# Patient Record
Sex: Female | Born: 1975 | Race: Black or African American | Hispanic: No | Marital: Married | Smoking: Never smoker
Health system: Southern US, Community
[De-identification: ages and names within clinical notes are randomized; demographics above are authoritative.]

## PROBLEM LIST (undated history)

## (undated) HISTORY — PX: WISDOM TOOTH EXTRACTION: SHX21

---

## 2019-11-29 ENCOUNTER — Other Ambulatory Visit: Payer: Self-pay | Admitting: Internal Medicine

## 2019-11-29 DIAGNOSIS — Z1159 Encounter for screening for other viral diseases: Secondary | ICD-10-CM | POA: Diagnosis not present

## 2019-11-29 DIAGNOSIS — Z13220 Encounter for screening for lipoid disorders: Secondary | ICD-10-CM | POA: Diagnosis not present

## 2019-11-29 DIAGNOSIS — Z1231 Encounter for screening mammogram for malignant neoplasm of breast: Secondary | ICD-10-CM | POA: Diagnosis not present

## 2019-11-29 DIAGNOSIS — Z114 Encounter for screening for human immunodeficiency virus [HIV]: Secondary | ICD-10-CM | POA: Diagnosis not present

## 2019-11-29 DIAGNOSIS — Z0000 Encounter for general adult medical examination without abnormal findings: Secondary | ICD-10-CM | POA: Diagnosis not present

## 2019-11-29 DIAGNOSIS — Z131 Encounter for screening for diabetes mellitus: Secondary | ICD-10-CM | POA: Diagnosis not present

## 2019-12-12 ENCOUNTER — Encounter: Payer: Self-pay | Admitting: Radiology

## 2019-12-12 ENCOUNTER — Ambulatory Visit
Admission: RE | Admit: 2019-12-12 | Discharge: 2019-12-12 | Disposition: A | Discharge status: Home / Self Care | Payer: 59 | Source: Ambulatory Visit | Attending: Internal Medicine | Admitting: Internal Medicine

## 2019-12-12 DIAGNOSIS — Z1231 Encounter for screening mammogram for malignant neoplasm of breast: Secondary | ICD-10-CM | POA: Diagnosis not present

## 2019-12-19 ENCOUNTER — Other Ambulatory Visit: Payer: Self-pay | Admitting: Internal Medicine

## 2019-12-19 ENCOUNTER — Other Ambulatory Visit: Payer: 59

## 2019-12-19 ENCOUNTER — Other Ambulatory Visit: Payer: Self-pay

## 2019-12-19 ENCOUNTER — Ambulatory Visit
Admission: RE | Admit: 2019-12-19 | Discharge: 2019-12-19 | Disposition: A | Discharge status: Home / Self Care | Payer: 59 | Source: Ambulatory Visit | Attending: Internal Medicine | Admitting: Internal Medicine

## 2019-12-19 DIAGNOSIS — M79662 Pain in left lower leg: Secondary | ICD-10-CM | POA: Insufficient documentation

## 2019-12-19 DIAGNOSIS — M79661 Pain in right lower leg: Secondary | ICD-10-CM

## 2020-03-05 DIAGNOSIS — Z8742 Personal history of other diseases of the female genital tract: Secondary | ICD-10-CM | POA: Diagnosis not present

## 2020-03-05 DIAGNOSIS — Z01419 Encounter for gynecological examination (general) (routine) without abnormal findings: Secondary | ICD-10-CM | POA: Diagnosis not present

## 2020-06-20 DIAGNOSIS — Z20822 Contact with and (suspected) exposure to covid-19: Secondary | ICD-10-CM | POA: Diagnosis not present

## 2020-06-23 DIAGNOSIS — Z20828 Contact with and (suspected) exposure to other viral communicable diseases: Secondary | ICD-10-CM | POA: Diagnosis not present

## 2020-08-22 DIAGNOSIS — L292 Pruritus vulvae: Secondary | ICD-10-CM | POA: Diagnosis not present

## 2020-08-22 DIAGNOSIS — N898 Other specified noninflammatory disorders of vagina: Secondary | ICD-10-CM | POA: Diagnosis not present

## 2020-11-05 ENCOUNTER — Other Ambulatory Visit: Payer: Self-pay | Admitting: Internal Medicine

## 2020-11-05 DIAGNOSIS — Z1231 Encounter for screening mammogram for malignant neoplasm of breast: Secondary | ICD-10-CM

## 2020-12-12 ENCOUNTER — Other Ambulatory Visit: Payer: Self-pay

## 2020-12-12 MED ORDER — LEVOFLOXACIN 500 MG PO TABS
ORAL_TABLET | ORAL | 0 refills | Status: DC
  Filled 2020-12-12: qty 10, 10d supply, fill #0

## 2020-12-12 MED ORDER — METRONIDAZOLE 500 MG PO TABS
500.0000 mg | ORAL_TABLET | ORAL | 0 refills | Status: DC
  Filled 2020-12-12: qty 30, 10d supply, fill #0

## 2021-01-22 ENCOUNTER — Ambulatory Visit
Admission: RE | Admit: 2021-01-22 | Discharge: 2021-01-22 | Disposition: A | Discharge status: Home / Self Care | Payer: 59 | Source: Ambulatory Visit | Attending: Internal Medicine | Admitting: Internal Medicine

## 2021-01-22 ENCOUNTER — Other Ambulatory Visit: Payer: Self-pay

## 2021-01-22 DIAGNOSIS — Z1231 Encounter for screening mammogram for malignant neoplasm of breast: Secondary | ICD-10-CM | POA: Diagnosis not present

## 2021-02-18 DIAGNOSIS — Z131 Encounter for screening for diabetes mellitus: Secondary | ICD-10-CM | POA: Diagnosis not present

## 2021-02-18 DIAGNOSIS — Z13220 Encounter for screening for lipoid disorders: Secondary | ICD-10-CM | POA: Diagnosis not present

## 2021-02-18 DIAGNOSIS — Z0000 Encounter for general adult medical examination without abnormal findings: Secondary | ICD-10-CM | POA: Diagnosis not present

## 2021-03-04 ENCOUNTER — Other Ambulatory Visit: Payer: Self-pay

## 2021-03-04 DIAGNOSIS — Z0000 Encounter for general adult medical examination without abnormal findings: Secondary | ICD-10-CM | POA: Diagnosis not present

## 2021-03-04 DIAGNOSIS — Z23 Encounter for immunization: Secondary | ICD-10-CM | POA: Diagnosis not present

## 2021-03-04 DIAGNOSIS — E559 Vitamin D deficiency, unspecified: Secondary | ICD-10-CM | POA: Diagnosis not present

## 2021-03-04 DIAGNOSIS — Z1211 Encounter for screening for malignant neoplasm of colon: Secondary | ICD-10-CM | POA: Diagnosis not present

## 2021-03-04 MED ORDER — VITAMIN D (ERGOCALCIFEROL) 1.25 MG (50000 UNIT) PO CAPS
ORAL_CAPSULE | ORAL | 1 refills | Status: DC
  Filled 2021-03-04 – 2021-04-06 (×2): qty 12, 84d supply, fill #0
  Filled 2021-10-13: qty 12, 84d supply, fill #1

## 2021-03-18 DIAGNOSIS — Z1211 Encounter for screening for malignant neoplasm of colon: Secondary | ICD-10-CM | POA: Diagnosis not present

## 2021-03-23 LAB — COLOGUARD: COLOGUARD: NEGATIVE

## 2021-04-06 ENCOUNTER — Other Ambulatory Visit: Payer: Self-pay

## 2021-04-14 ENCOUNTER — Other Ambulatory Visit: Payer: Self-pay

## 2021-04-15 DIAGNOSIS — T8332XA Displacement of intrauterine contraceptive device, initial encounter: Secondary | ICD-10-CM | POA: Diagnosis not present

## 2021-04-28 ENCOUNTER — Other Ambulatory Visit: Payer: Self-pay

## 2021-04-28 MED ORDER — OSELTAMIVIR PHOSPHATE 75 MG PO CAPS: 75.0000 mg | ORAL_CAPSULE | Freq: Two times a day (BID) | ORAL | 0 refills | Status: DC

## 2021-05-11 DIAGNOSIS — T8332XA Displacement of intrauterine contraceptive device, initial encounter: Secondary | ICD-10-CM | POA: Diagnosis not present

## 2021-05-29 DIAGNOSIS — T8332XD Displacement of intrauterine contraceptive device, subsequent encounter: Secondary | ICD-10-CM | POA: Diagnosis not present

## 2021-05-29 DIAGNOSIS — Z30433 Encounter for removal and reinsertion of intrauterine contraceptive device: Secondary | ICD-10-CM | POA: Diagnosis not present

## 2021-07-15 DIAGNOSIS — Z30431 Encounter for routine checking of intrauterine contraceptive device: Secondary | ICD-10-CM | POA: Diagnosis not present

## 2021-08-19 IMAGING — MG MM DIGITAL SCREENING BILAT W/ TOMO AND CAD
8 series · 8 of 24 positions shown · non-contrast
Comparison: Previous exam(s).

CLINICAL DATA: Screening.

EXAM:
DIGITAL SCREENING BILATERAL MAMMOGRAM WITH TOMOSYNTHESIS AND CAD
TECHNIQUE: Bilateral screening digital craniocaudal and mediolateral oblique
mammograms were obtained. Bilateral screening digital breast
tomosynthesis was performed. The images were evaluated with
computer-aided detection.

[R CC synth-2D]
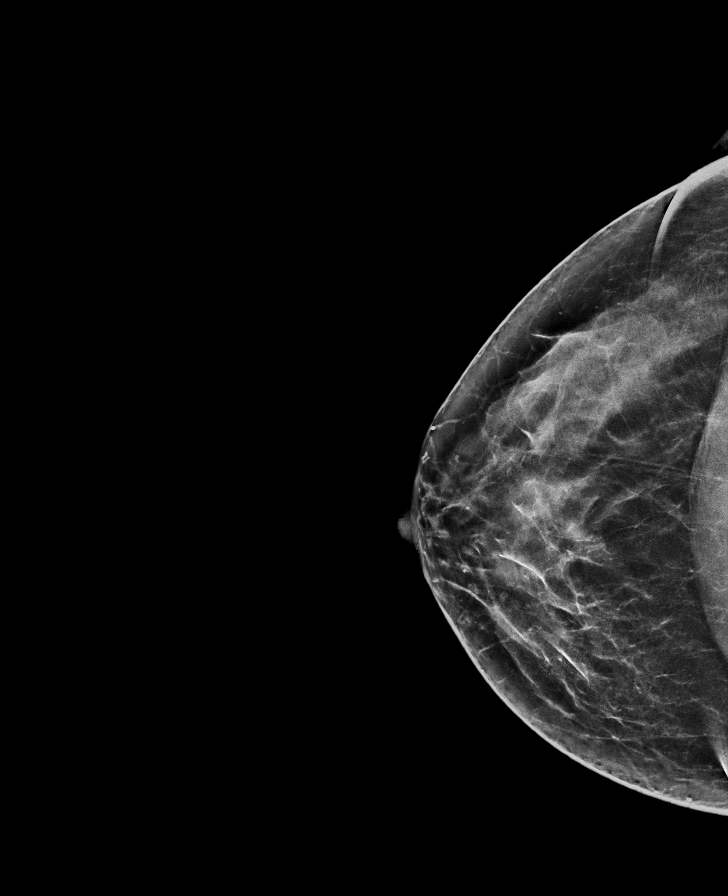

[L MLO synth-2D]
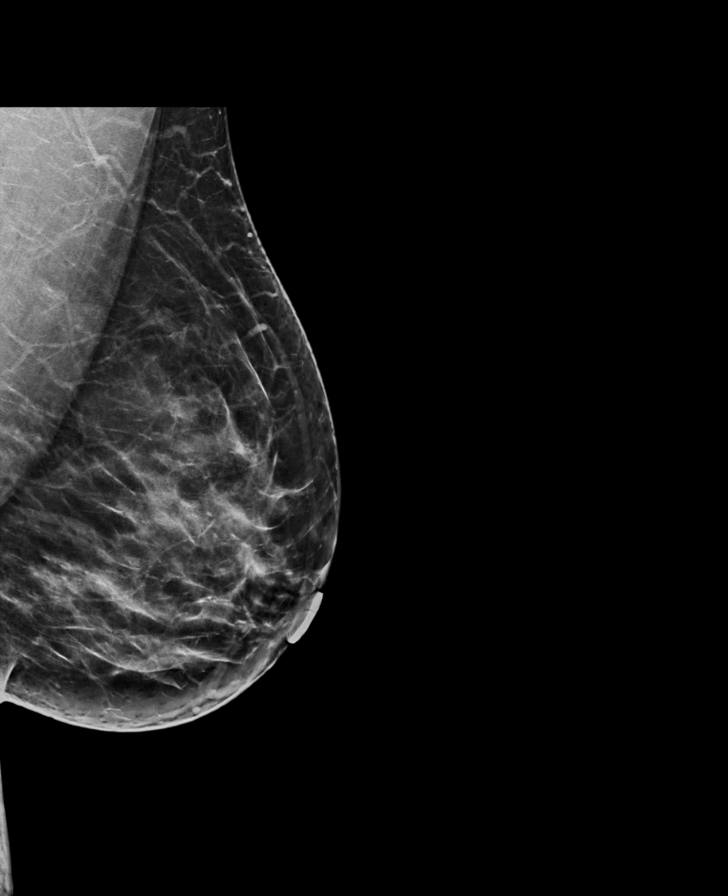

[L CC synth-2D]
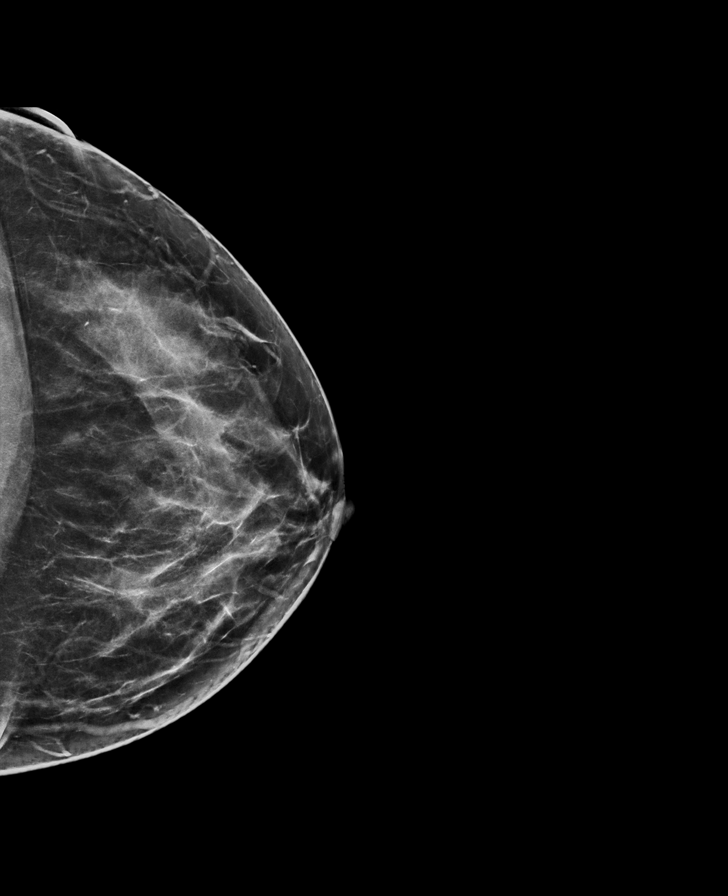

[R MLO synth-2D]
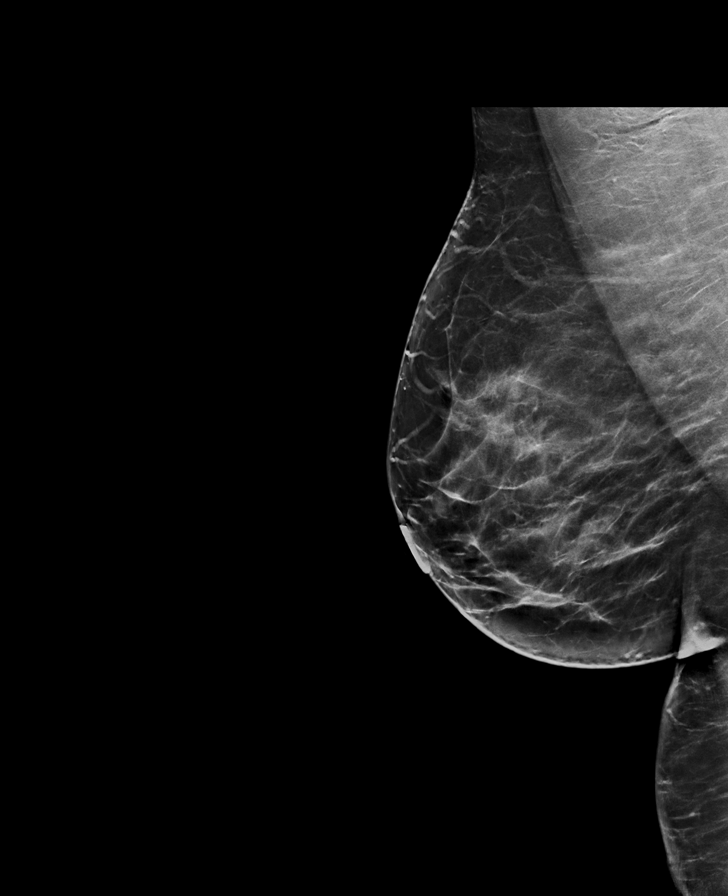

[R CC tomo · tomo slice 34/67.0]
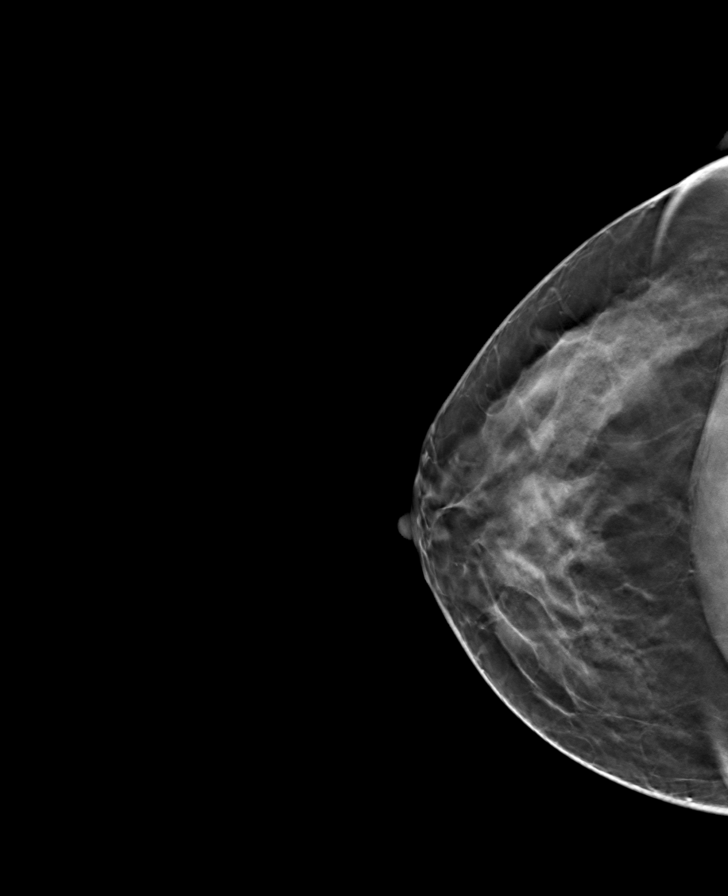

[L MLO tomo · tomo slice 37/72.0]
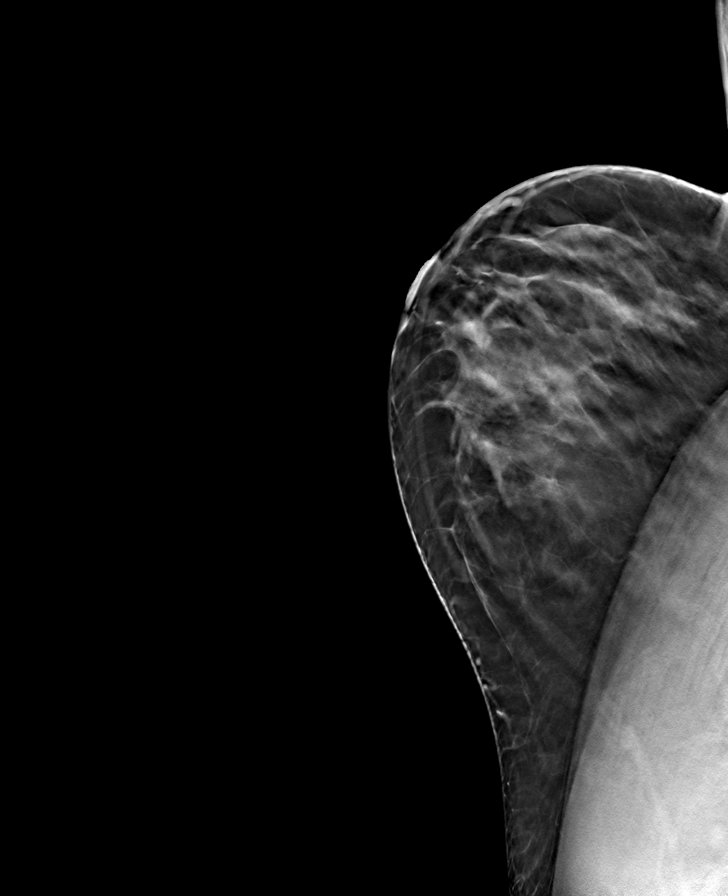

[L CC tomo · tomo slice 35/68.0]
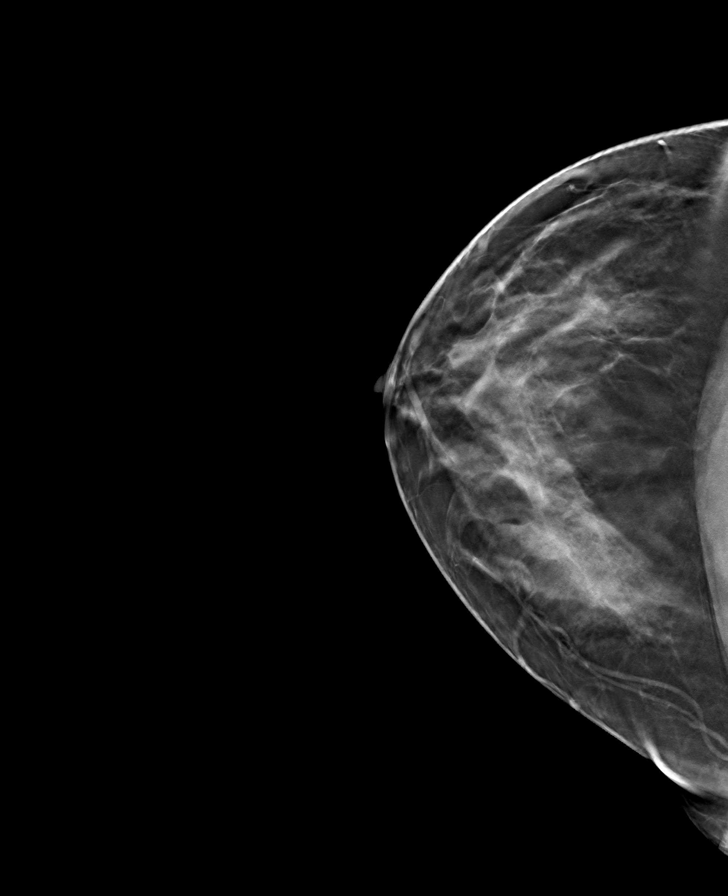

[R MLO tomo · tomo slice 39/77.0]
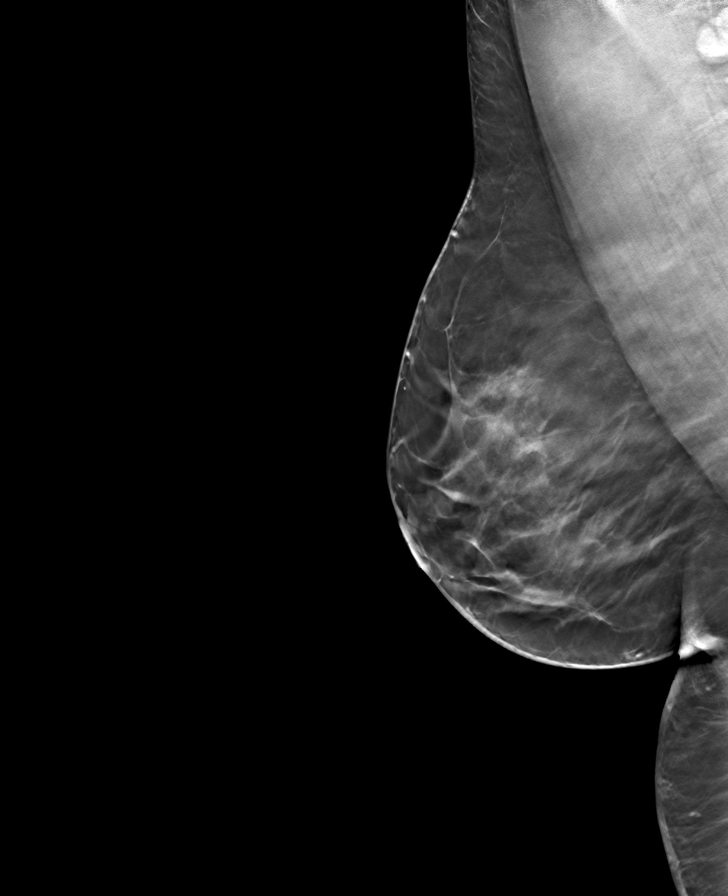

[8 of 24 positions shown; findings below may reference images not displayed]

ACR Breast Density Category c: The breast tissue is heterogeneously
dense, which may obscure small masses.
FINDINGS: There are no findings suspicious for malignancy.
IMPRESSION: No mammographic evidence of malignancy. A result letter of this
screening mammogram will be mailed directly to the patient.

RECOMMENDATION:
Screening mammogram in one year. (Code:Q3-W-BC3)

BI-RADS CATEGORY  1: Negative.

## 2021-09-08 DIAGNOSIS — Z1331 Encounter for screening for depression: Secondary | ICD-10-CM | POA: Diagnosis not present

## 2021-09-08 DIAGNOSIS — Z01419 Encounter for gynecological examination (general) (routine) without abnormal findings: Secondary | ICD-10-CM | POA: Diagnosis not present

## 2021-09-08 DIAGNOSIS — Z1231 Encounter for screening mammogram for malignant neoplasm of breast: Secondary | ICD-10-CM | POA: Diagnosis not present

## 2021-10-13 ENCOUNTER — Other Ambulatory Visit: Payer: Self-pay

## 2021-12-10 ENCOUNTER — Other Ambulatory Visit: Payer: Self-pay

## 2021-12-31 ENCOUNTER — Other Ambulatory Visit: Payer: Self-pay | Admitting: Internal Medicine

## 2022-01-25 ENCOUNTER — Other Ambulatory Visit: Payer: Self-pay | Admitting: Internal Medicine

## 2022-01-25 DIAGNOSIS — Z1231 Encounter for screening mammogram for malignant neoplasm of breast: Secondary | ICD-10-CM

## 2022-03-17 ENCOUNTER — Ambulatory Visit
Admission: RE | Admit: 2022-03-17 | Discharge: 2022-03-17 | Disposition: A | Discharge status: Home / Self Care | Payer: 59 | Source: Ambulatory Visit | Attending: Internal Medicine | Admitting: Internal Medicine

## 2022-03-17 DIAGNOSIS — Z1231 Encounter for screening mammogram for malignant neoplasm of breast: Secondary | ICD-10-CM

## 2022-04-27 ENCOUNTER — Other Ambulatory Visit: Payer: Self-pay

## 2022-05-10 ENCOUNTER — Other Ambulatory Visit: Payer: Self-pay

## 2022-05-11 ENCOUNTER — Other Ambulatory Visit: Payer: Self-pay

## 2022-05-11 DIAGNOSIS — M4317 Spondylolisthesis, lumbosacral region: Secondary | ICD-10-CM | POA: Diagnosis not present

## 2022-05-11 DIAGNOSIS — E559 Vitamin D deficiency, unspecified: Secondary | ICD-10-CM | POA: Diagnosis not present

## 2022-05-11 DIAGNOSIS — M545 Low back pain, unspecified: Secondary | ICD-10-CM | POA: Diagnosis not present

## 2022-05-11 MED ORDER — NAPROXEN 500 MG PO TABS
ORAL_TABLET | ORAL | 0 refills | Status: DC
  Filled 2022-05-11: qty 40, 20d supply, fill #0

## 2022-05-11 MED ORDER — VITAMIN D (ERGOCALCIFEROL) 1.25 MG (50000 UNIT) PO CAPS
50000.0000 [IU] | ORAL_CAPSULE | ORAL | 1 refills | Status: AC
  Filled 2022-05-11: qty 12, 84d supply, fill #0

## 2022-05-11 MED ORDER — CYCLOBENZAPRINE HCL 10 MG PO TABS
10.0000 mg | ORAL_TABLET | Freq: Every evening | ORAL | 0 refills | Status: DC | PRN
  Filled 2022-05-11: qty 20, 20d supply, fill #0

## 2022-05-12 ENCOUNTER — Other Ambulatory Visit: Payer: Self-pay

## 2022-06-09 ENCOUNTER — Encounter: Payer: Self-pay | Admitting: Physical Therapy

## 2022-06-09 ENCOUNTER — Ambulatory Visit: Payer: 59 | Attending: Internal Medicine

## 2022-06-09 DIAGNOSIS — M5459 Other low back pain: Secondary | ICD-10-CM | POA: Diagnosis not present
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
## 2022-07-08 ENCOUNTER — Ambulatory Visit: Payer: Commercial Managed Care - PPO | Attending: Internal Medicine

## 2022-07-08 DIAGNOSIS — M5459 Other low back pain: Secondary | ICD-10-CM

## 2022-07-08 DIAGNOSIS — L723 Sebaceous cyst: Secondary | ICD-10-CM | POA: Diagnosis not present

## 2022-07-08 DIAGNOSIS — S01312A Laceration without foreign body of left ear, initial encounter: Secondary | ICD-10-CM | POA: Diagnosis not present
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
## 2022-07-13 ENCOUNTER — Encounter: Payer: Self-pay | Admitting: Physical Therapy

## 2022-07-13 ENCOUNTER — Ambulatory Visit: Payer: Commercial Managed Care - PPO | Admitting: Physical Therapy

## 2022-07-13 DIAGNOSIS — M5459 Other low back pain: Secondary | ICD-10-CM | POA: Diagnosis not present
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
## 2022-07-15 ENCOUNTER — Ambulatory Visit: Payer: Commercial Managed Care - PPO | Admitting: Physical Therapy

## 2022-07-19 ENCOUNTER — Encounter: Payer: Self-pay | Admitting: Physical Therapy

## 2022-07-19 ENCOUNTER — Ambulatory Visit: Payer: Commercial Managed Care - PPO | Admitting: Physical Therapy

## 2022-07-19 DIAGNOSIS — M5459 Other low back pain: Secondary | ICD-10-CM | POA: Diagnosis not present
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
## 2022-07-21 ENCOUNTER — Ambulatory Visit: Payer: Commercial Managed Care - PPO | Admitting: Physical Therapy

## 2022-07-21 ENCOUNTER — Encounter: Payer: Self-pay | Admitting: Physical Therapy

## 2022-07-21 DIAGNOSIS — M5459 Other low back pain: Secondary | ICD-10-CM | POA: Diagnosis not present
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
## 2022-07-26 ENCOUNTER — Encounter: Payer: Self-pay | Admitting: Physical Therapy

## 2022-07-27 ENCOUNTER — Ambulatory Visit: Payer: Commercial Managed Care - PPO | Admitting: Physical Therapy

## 2022-07-27 ENCOUNTER — Encounter: Payer: Self-pay | Admitting: Physical Therapy

## 2022-07-27 DIAGNOSIS — M5459 Other low back pain: Secondary | ICD-10-CM | POA: Diagnosis not present
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
## 2022-07-29 ENCOUNTER — Telehealth: Payer: Self-pay | Admitting: Physical Therapy

## 2022-07-29 ENCOUNTER — Ambulatory Visit: Payer: Commercial Managed Care - PPO | Admitting: Physical Therapy

## 2022-07-29 NOTE — Telephone Encounter (Signed)
Called pt about missing apt. Pt responded by saying she had requested that today's apt be removed and that she would be coming next week.

## 2022-08-02 ENCOUNTER — Ambulatory Visit: Payer: Commercial Managed Care - PPO | Attending: Internal Medicine | Admitting: Physical Therapy

## 2022-08-02 ENCOUNTER — Encounter: Payer: Self-pay | Admitting: Physical Therapy

## 2022-08-02 DIAGNOSIS — S01312A Laceration without foreign body of left ear, initial encounter: Secondary | ICD-10-CM | POA: Diagnosis not present

## 2022-08-02 DIAGNOSIS — M5459 Other low back pain: Secondary | ICD-10-CM | POA: Insufficient documentation

## 2022-08-02 DIAGNOSIS — L723 Sebaceous cyst: Secondary | ICD-10-CM | POA: Diagnosis not present
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
## 2022-08-03 ENCOUNTER — Ambulatory Visit: Payer: Commercial Managed Care - PPO | Admitting: Physical Therapy

## 2022-08-03 DIAGNOSIS — M5459 Other low back pain: Secondary | ICD-10-CM | POA: Diagnosis not present
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
## 2022-08-04 ENCOUNTER — Ambulatory Visit: Payer: Commercial Managed Care - PPO | Admitting: Physical Therapy

## 2022-08-09 ENCOUNTER — Ambulatory Visit: Payer: Commercial Managed Care - PPO | Admitting: Physical Therapy

## 2022-08-09 DIAGNOSIS — M5459 Other low back pain: Secondary | ICD-10-CM | POA: Diagnosis not present
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
## 2022-08-10 ENCOUNTER — Ambulatory Visit: Payer: Commercial Managed Care - PPO | Admitting: Physical Therapy

## 2022-08-10 ENCOUNTER — Encounter: Payer: Self-pay | Admitting: Physical Therapy

## 2022-08-10 DIAGNOSIS — M5459 Other low back pain: Secondary | ICD-10-CM | POA: Diagnosis not present
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
## 2022-08-12 ENCOUNTER — Encounter: Payer: Self-pay | Admitting: Physical Therapy

## 2022-08-16 ENCOUNTER — Ambulatory Visit: Payer: Commercial Managed Care - PPO | Admitting: Physical Therapy

## 2022-08-17 ENCOUNTER — Ambulatory Visit: Payer: Commercial Managed Care - PPO | Admitting: Physical Therapy

## 2022-08-17 ENCOUNTER — Encounter: Payer: Self-pay | Admitting: Physical Therapy

## 2022-08-17 DIAGNOSIS — M5459 Other low back pain: Secondary | ICD-10-CM | POA: Diagnosis not present
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
## 2022-08-18 ENCOUNTER — Ambulatory Visit: Payer: Commercial Managed Care - PPO | Admitting: Physical Therapy

## 2022-08-19 ENCOUNTER — Encounter: Payer: Self-pay | Admitting: Otolaryngology

## 2022-08-19 ENCOUNTER — Ambulatory Visit: Payer: Commercial Managed Care - PPO | Admitting: Physical Therapy

## 2022-08-19 ENCOUNTER — Telehealth: Payer: Self-pay | Admitting: Physical Therapy

## 2022-08-19 ENCOUNTER — Other Ambulatory Visit: Payer: Self-pay

## 2022-08-19 NOTE — Telephone Encounter (Signed)
Attempted to call patient. Did not reach but left VM instructing to call back to reschedule.

## 2022-08-23 ENCOUNTER — Ambulatory Visit: Payer: Commercial Managed Care - PPO | Admitting: Physical Therapy

## 2022-08-24 ENCOUNTER — Ambulatory Visit
Admission: RE | Admit: 2022-08-24 | Discharge: 2022-08-24 | Disposition: A | Discharge status: Home / Self Care | Payer: Commercial Managed Care - PPO | Attending: Otolaryngology | Admitting: Otolaryngology

## 2022-08-24 ENCOUNTER — Encounter
Admission: RE | Disposition: A | Discharge status: Home / Self Care | Payer: Self-pay | Source: Home / Self Care | Attending: Otolaryngology

## 2022-08-24 ENCOUNTER — Ambulatory Visit: Payer: Commercial Managed Care - PPO | Admitting: Physical Therapy

## 2022-08-24 ENCOUNTER — Other Ambulatory Visit: Payer: Self-pay

## 2022-08-24 ENCOUNTER — Ambulatory Visit: Payer: Commercial Managed Care - PPO | Admitting: General Practice

## 2022-08-24 ENCOUNTER — Encounter: Payer: Self-pay | Admitting: Otolaryngology

## 2022-08-24 DIAGNOSIS — L723 Sebaceous cyst: Secondary | ICD-10-CM | POA: Diagnosis not present

## 2022-08-24 DIAGNOSIS — L720 Epidermal cyst: Secondary | ICD-10-CM | POA: Diagnosis not present

## 2022-08-24 DIAGNOSIS — S01312A Laceration without foreign body of left ear, initial encounter: Secondary | ICD-10-CM | POA: Diagnosis not present

## 2022-08-24 DIAGNOSIS — X58XXXA Exposure to other specified factors, initial encounter: Secondary | ICD-10-CM | POA: Insufficient documentation

## 2022-08-24 DIAGNOSIS — M5459 Other low back pain: Secondary | ICD-10-CM

## 2022-08-24 HISTORY — PX: OTOPLASATY: SHX1485

## 2022-08-24 HISTORY — PX: MASS EXCISION: SHX2000

## 2022-08-24 LAB — POCT PREGNANCY, URINE: Preg Test, Ur: NEGATIVE

## 2022-08-24 SURGERY — EXCISION MASS
Anesthesia: General | Laterality: Left

## 2022-08-24 MED ORDER — PROPOFOL 1000 MG/100ML IV EMUL
INTRAVENOUS | Status: AC
  Filled 2022-08-24: qty 100

## 2022-08-24 MED ORDER — BACITRACIN 500 UNIT/GM EX OINT
TOPICAL_OINTMENT | CUTANEOUS | Status: DC | PRN
  Administered 2022-08-24: 2 via TOPICAL

## 2022-08-24 MED ORDER — ONDANSETRON HCL 4 MG/2ML IJ SOLN
INTRAMUSCULAR | Status: DC | PRN
  Administered 2022-08-24: 4 mg via INTRAVENOUS

## 2022-08-24 MED ORDER — PROPOFOL 10 MG/ML IV BOLUS
INTRAVENOUS | Status: DC | PRN
  Administered 2022-08-24: 100 mg via INTRAVENOUS
  Administered 2022-08-24: 50 mg via INTRAVENOUS

## 2022-08-24 MED ORDER — LACTATED RINGERS IV SOLN: INTRAVENOUS | Status: DC

## 2022-08-24 MED ORDER — ACETAMINOPHEN 10 MG/ML IV SOLN
INTRAVENOUS | Status: DC | PRN
  Administered 2022-08-24: 1000 mg via INTRAVENOUS

## 2022-08-24 MED ORDER — PHENYLEPHRINE HCL-NACL 20-0.9 MG/250ML-% IV SOLN
INTRAVENOUS | Status: AC
  Filled 2022-08-24: qty 250

## 2022-08-24 MED ORDER — MIDAZOLAM HCL 5 MG/5ML IJ SOLN
INTRAMUSCULAR | Status: DC | PRN
  Administered 2022-08-24: 2 mg via INTRAVENOUS

## 2022-08-24 MED ORDER — LIDOCAINE-EPINEPHRINE 1 %-1:100000 IJ SOLN
INTRAMUSCULAR | Status: DC | PRN
  Administered 2022-08-24: 2 mL via INTRADERMAL

## 2022-08-24 MED ORDER — FENTANYL CITRATE (PF) 100 MCG/2ML IJ SOLN
INTRAMUSCULAR | Status: DC | PRN
  Administered 2022-08-24 (×2): 50 ug via INTRAVENOUS

## 2022-08-24 MED ORDER — EPHEDRINE SULFATE (PRESSORS) 50 MG/ML IJ SOLN
INTRAMUSCULAR | Status: DC | PRN
  Administered 2022-08-24: 10 mg via INTRAVENOUS

## 2022-08-24 MED ORDER — PROPOFOL 1000 MG/100ML IV EMUL
INTRAVENOUS | Status: AC
  Filled 2022-08-24: qty 400

## 2022-08-24 MED ORDER — DEXAMETHASONE SODIUM PHOSPHATE 4 MG/ML IJ SOLN
INTRAMUSCULAR | Status: DC | PRN
  Administered 2022-08-24: 4 mg via INTRAVENOUS

## 2022-08-24 SURGICAL SUPPLY — 35 items
ADH SKN CLS APL DERMABOND .7 (GAUZE/BANDAGES/DRESSINGS)
APPLICATOR COTTON TIP WD 3 STR (MISCELLANEOUS) IMPLANT
CANISTER SUCT 1200ML W/VALVE (MISCELLANEOUS) IMPLANT
CORD BIP STRL DISP 12FT (MISCELLANEOUS) IMPLANT
DERMABOND ADVANCED .7 DNX12 (GAUZE/BANDAGES/DRESSINGS) IMPLANT
DRSG TELFA 4X3 1S NADH ST (GAUZE/BANDAGES/DRESSINGS) IMPLANT
ELECT CAUTERY NDL 2.0 MIC (NEEDLE) IMPLANT
ELECT CAUTERY NEEDLE 2.0 MIC (NEEDLE) IMPLANT
ELECT REM PT RETURN 9FT ADLT (ELECTROSURGICAL) ×1
ELECTRODE REM PT RTRN 9FT ADLT (ELECTROSURGICAL) ×1 IMPLANT
GAUZE SPONGE 4X4 12PLY STRL (GAUZE/BANDAGES/DRESSINGS) IMPLANT
GLOVE SURG ENC MOIS LTX SZ7.5 (GLOVE) ×2 IMPLANT
GOWN STRL REUS W/ TWL LRG LVL3 (GOWN DISPOSABLE) ×1 IMPLANT
GOWN STRL REUS W/TWL LRG LVL3 (GOWN DISPOSABLE) ×1
KIT TURNOVER KIT A (KITS) ×1 IMPLANT
NDL HYPO 25GX1X1/2 BEV (NEEDLE) ×1 IMPLANT
NEEDLE HYPO 25GX1X1/2 BEV (NEEDLE) ×1 IMPLANT
NS IRRIG 500ML POUR BTL (IV SOLUTION) ×1 IMPLANT
PACK ENT CUSTOM (PACKS) ×1 IMPLANT
PENCIL SMOKE EVACUATOR (MISCELLANEOUS) ×1 IMPLANT
SOL PREP PVP 2OZ (MISCELLANEOUS)
SOLUTION PREP PVP 2OZ (MISCELLANEOUS) IMPLANT
STRAP BODY AND KNEE 60X3 (MISCELLANEOUS) ×1 IMPLANT
SUCTION FRAZIER HANDLE 10FR (MISCELLANEOUS)
SUCTION TUBE FRAZIER 10FR DISP (MISCELLANEOUS) IMPLANT
SUT PLAIN GUT FAST 5-0 (SUTURE) IMPLANT
SUT PROLENE 5 0 P 3 (SUTURE) IMPLANT
SUT PROLENE 5 0 PS 3 (SUTURE) IMPLANT
SUT VIC AB 4-0 RB1 27 (SUTURE)
SUT VIC AB 4-0 RB1 27X BRD (SUTURE) IMPLANT
SUT VIC AB 5-0 P-3 18X BRD (SUTURE) IMPLANT
SUT VIC AB 5-0 P3 18 (SUTURE)
SUT VICRYL 5-0 (SUTURE) IMPLANT
SYR 10ML LL (SYRINGE) ×1 IMPLANT
TOWEL OR 17X26 4PK STRL BLUE (TOWEL DISPOSABLE) ×1 IMPLANT

## 2022-08-24 NOTE — Anesthesia Postprocedure Evaluation (Signed)
Anesthesia Post Note  Patient: Lisa Bender  Procedure(s) Performed: EXCISION SUBCUTANEOUS NODULE -MIDLINE NECK (10X6CM) INTERMITENT CLOSURE (Left) EAR LOBE REPAIR 6MM (Left)  Patient location during evaluation: PACU Anesthesia Type: General Level of consciousness: awake and alert Pain management: pain level controlled Vital Signs Assessment: post-procedure vital signs reviewed and stable Respiratory status: spontaneous breathing, nonlabored ventilation, respiratory function stable and patient connected to nasal cannula oxygen Cardiovascular status: blood pressure returned to baseline and stable Postop Assessment: no apparent nausea or vomiting Anesthetic complications: no   No notable events documented.   Last Vitals:  Vitals:   08/24/22 1207  BP: 130/83  Resp: 16  Temp: 36.7 C  SpO2: 100%    Last Pain:  Vitals:   08/24/22 1207  TempSrc: Tympanic  PainSc: 0-No pain                 Arita Miss
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
## 2022-08-24 NOTE — Transfer of Care (Signed)
Immediate Anesthesia Transfer of Care Note  Patient: Lisa Bender  Procedure(s) Performed: EXCISION SUBCUTANEOUS NODULE -MIDLINE NECK (10X6CM) INTERMITENT CLOSURE (Left) EAR LOBE REPAIR 6MM (Left)  Patient Location: PACU  Anesthesia Type: General  Level of Consciousness: awake, alert  and patient cooperative  Airway and Oxygen Therapy: Patient Spontanous Breathing and Patient connected to supplemental oxygen  Post-op Assessment: Post-op Vital signs reviewed, Patient's Cardiovascular Status Stable, Respiratory Function Stable, Patent Airway and No signs of Nausea or vomiting  Post-op Vital Signs: Reviewed and stable  Complications: No notable events documented.

## 2022-08-24 NOTE — Op Note (Signed)
  08/24/2022  1:32 PM    Lisa Bender  [id number]   Pre-Op Diagnosis:  Anterior Neck Sebaceous cyst, Left earlobe Laceration  Post-op Diagnosis: Anterior Neck Sebaceous cyst, Left earlobe Laceration  Procedure:   1) Excision of anterior neck sebaceous cyst with intermediate repair (2cm Length), 2) Repair of left earlobe laceration (1 cm)  Surgeon:  Riley Nearing  Anesthesia:  General with laryngeal mask airway  EBL:  minimal  Complications:  None  Findings: approximately 6 mm subcutaeous cystic mass of anterior neck, just to left of midline. 1 cm healed earlobe laceration  Procedure: After the patient was identified in holding and the procedure was reviewed.  The patient was taken to the operating room and with the patient in a comfortable supine position,  general anesthesia was induced without difficulty with LMA.   Skin was injected with 1% lidocaine with epinephrine 1-100,000 around the neck cyst and in the left earlobe. The patient was then prepped and draped in the usual sterile fashion. A 15 blade was used to incise the skin carrying the incision down through the subcutaneous tissues in an ellipse around the cyst. The cyst was excised with a cuff of normal adipose tissue using scissors and the bipolar cautery. The wound was closed with a 5-0 vicryl for subcutaneous closure, followed by a 5-0 Prolene for the skin using a running locked suture.   Next, the healed edges of the earlobe wound were excised, using a 15 blade and scissors, in order to repair the split in the lobe left by the laceration. The woudn was then closed using a 5-0 vicryl for subcutaneous closure, followed by a 5-0 Prolene for the skin. Bacitracin ointment was applied to both wounds, and a bandaid placed over the neck wound.   The patient was then returned to the anesthesiologist for awakening. The patient was awakened and taken to recovery room in good condition postoperatively.  Disposition:   PACU then  d/c home   Riley Nearing 08/24/2022 1:32 PM

## 2022-08-24 NOTE — H&P (Signed)
History and physical reviewed and will be scanned in later. No change in medical status reported by the patient or family, appears stable for surgery. All questions regarding the procedure answered, and patient (or family if a child) expressed understanding of the procedure. ? ?Lisa Bender ?@TODAY@ ?

## 2022-08-24 NOTE — Anesthesia Preprocedure Evaluation (Signed)
Anesthesia Evaluation  Patient identified by MRN, date of birth, ID band Patient awake    Reviewed: Allergy & Precautions, NPO status , Patient's Chart, lab work & pertinent test results  History of Anesthesia Complications Negative for: history of anesthetic complications  Airway Mallampati: III  TM Distance: >3 FB Neck ROM: Full    Dental no notable dental hx. (+) Teeth Intact   Pulmonary neg pulmonary ROS, neg sleep apnea, neg COPD, Patient abstained from smoking.Not current smoker   Pulmonary exam normal breath sounds clear to auscultation       Cardiovascular Exercise Tolerance: Good METS(-) hypertension(-) CAD and (-) Past MI negative cardio ROS (-) dysrhythmias  Rhythm:Regular Rate:Normal - Systolic murmurs    Neuro/Psych negative neurological ROS  negative psych ROS   GI/Hepatic ,neg GERD  ,,(+)     (-) substance abuse    Endo/Other  neg diabetes    Renal/GU negative Renal ROS     Musculoskeletal   Abdominal   Peds  Hematology   Anesthesia Other Findings History reviewed. No pertinent past medical history.  Reproductive/Obstetrics                              Anesthesia Physical Anesthesia Plan  ASA: 1  Anesthesia Plan: General   Post-op Pain Management: Fentanyl IV   Induction: Intravenous  PONV Risk Score and Plan: 3 and Ondansetron, Dexamethasone and Midazolam  Airway Management Planned: LMA  Additional Equipment: None  Intra-op Plan:   Post-operative Plan: Extubation in OR  Informed Consent: I have reviewed the patients History and Physical, chart, labs and discussed the procedure including the risks, benefits and alternatives for the proposed anesthesia with the patient or authorized representative who has indicated his/her understanding and acceptance.     Dental advisory given  Plan Discussed with: CRNA and Surgeon  Anesthesia Plan Comments:  (Discussed risks of anesthesia with patient, including PONV, sore throat, lip/dental/eye damage. Rare risks discussed as well, such as cardiorespiratory and neurological sequelae, and allergic reactions. Discussed the role of CRNA in patient's perioperative care. Patient understands.)         Anesthesia Quick Evaluation

## 2022-08-25 ENCOUNTER — Encounter: Payer: Self-pay | Admitting: Otolaryngology

## 2022-08-26 ENCOUNTER — Ambulatory Visit: Payer: Commercial Managed Care - PPO | Admitting: Physical Therapy

## 2022-08-26 ENCOUNTER — Encounter: Payer: Self-pay | Admitting: Physical Therapy

## 2022-08-26 DIAGNOSIS — M5459 Other low back pain: Secondary | ICD-10-CM | POA: Diagnosis not present

## 2022-08-26 LAB — SURGICAL PATHOLOGY
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
## 2022-08-31 ENCOUNTER — Ambulatory Visit: Payer: Commercial Managed Care - PPO | Admitting: Physical Therapy

## 2022-09-02 ENCOUNTER — Ambulatory Visit: Payer: Commercial Managed Care - PPO | Attending: Internal Medicine | Admitting: Physical Therapy

## 2022-09-02 DIAGNOSIS — M5459 Other low back pain: Secondary | ICD-10-CM | POA: Diagnosis not present
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
## 2022-09-08 ENCOUNTER — Ambulatory Visit: Payer: Commercial Managed Care - PPO | Admitting: Physical Therapy

## 2022-09-09 ENCOUNTER — Ambulatory Visit: Payer: Commercial Managed Care - PPO | Admitting: Physical Therapy

## 2022-09-09 DIAGNOSIS — M5459 Other low back pain: Secondary | ICD-10-CM | POA: Diagnosis not present
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
## 2022-11-30 ENCOUNTER — Other Ambulatory Visit: Payer: Self-pay

## 2022-11-30 DIAGNOSIS — M4317 Spondylolisthesis, lumbosacral region: Secondary | ICD-10-CM | POA: Diagnosis not present

## 2022-11-30 DIAGNOSIS — Z0000 Encounter for general adult medical examination without abnormal findings: Secondary | ICD-10-CM | POA: Diagnosis not present

## 2022-11-30 DIAGNOSIS — E559 Vitamin D deficiency, unspecified: Secondary | ICD-10-CM | POA: Diagnosis not present

## 2022-11-30 MED ORDER — VITAMIN D (ERGOCALCIFEROL) 1.25 MG (50000 UNIT) PO CAPS
50000.0000 [IU] | ORAL_CAPSULE | ORAL | 1 refills | Status: AC
  Filled 2022-11-30: qty 12, 84d supply, fill #0

## 2022-12-01 ENCOUNTER — Other Ambulatory Visit: Payer: Self-pay | Admitting: Obstetrics and Gynecology

## 2022-12-01 DIAGNOSIS — Z01419 Encounter for gynecological examination (general) (routine) without abnormal findings: Secondary | ICD-10-CM | POA: Diagnosis not present

## 2022-12-01 DIAGNOSIS — E559 Vitamin D deficiency, unspecified: Secondary | ICD-10-CM | POA: Diagnosis not present

## 2022-12-01 DIAGNOSIS — Z1331 Encounter for screening for depression: Secondary | ICD-10-CM | POA: Diagnosis not present

## 2022-12-01 DIAGNOSIS — Z1231 Encounter for screening mammogram for malignant neoplasm of breast: Secondary | ICD-10-CM | POA: Diagnosis not present

## 2022-12-01 DIAGNOSIS — M4317 Spondylolisthesis, lumbosacral region: Secondary | ICD-10-CM | POA: Diagnosis not present

## 2022-12-01 DIAGNOSIS — Z0000 Encounter for general adult medical examination without abnormal findings: Secondary | ICD-10-CM | POA: Diagnosis not present

## 2022-12-28 ENCOUNTER — Emergency Department: Payer: Commercial Managed Care - PPO

## 2022-12-28 ENCOUNTER — Other Ambulatory Visit: Payer: Self-pay

## 2022-12-28 ENCOUNTER — Emergency Department
Admission: EM | Admit: 2022-12-28 | Discharge: 2022-12-28 | Disposition: A | Discharge status: Home / Self Care | Payer: Commercial Managed Care - PPO | Attending: Emergency Medicine | Admitting: Emergency Medicine

## 2022-12-28 ENCOUNTER — Encounter: Payer: Self-pay | Admitting: Emergency Medicine

## 2022-12-28 DIAGNOSIS — R791 Abnormal coagulation profile: Secondary | ICD-10-CM | POA: Insufficient documentation

## 2022-12-28 DIAGNOSIS — R079 Chest pain, unspecified: Secondary | ICD-10-CM | POA: Diagnosis not present

## 2022-12-28 DIAGNOSIS — R0789 Other chest pain: Secondary | ICD-10-CM | POA: Diagnosis not present

## 2022-12-28 DIAGNOSIS — T82898A Other specified complication of vascular prosthetic devices, implants and grafts, initial encounter: Secondary | ICD-10-CM | POA: Diagnosis not present

## 2022-12-28 LAB — COMPREHENSIVE METABOLIC PANEL
ALT: 19 U/L (ref 0–44)
AST: 25 U/L (ref 15–41)
Albumin: 3.6 g/dL (ref 3.5–5.0)
Alkaline Phosphatase: 64 U/L (ref 38–126)
Anion gap: 6 (ref 5–15)
BUN: 21 mg/dL — ABNORMAL HIGH (ref 6–20)
CO2: 22 mmol/L (ref 22–32)
Calcium: 8.3 mg/dL — ABNORMAL LOW (ref 8.9–10.3)
Chloride: 111 mmol/L (ref 98–111)
Creatinine, Ser: 0.98 mg/dL (ref 0.44–1.00)
GFR, Estimated: >60 mL/min (ref >60)
Glucose, Bld: 106 mg/dL — ABNORMAL HIGH (ref 70–99)
Potassium: 4.9 mmol/L (ref 3.5–5.1)
Sodium: 139 mmol/L (ref 135–145)
Total Bilirubin: 0.8 mg/dL (ref 0.3–1.2)
Total Protein: 6.5 g/dL (ref 6.5–8.1)

## 2022-12-28 LAB — CBC
HCT: 38.4 % (ref 36.0–46.0)
Hemoglobin: 13 g/dL (ref 12.0–15.0)
MCH: 29 pg (ref 26.0–34.0)
MCHC: 33.9 g/dL (ref 30.0–36.0)
MCV: 85.5 fL (ref 80.0–100.0)
Platelets: 157 10*3/uL (ref 150–400)
RBC: 4.49 MIL/uL (ref 3.87–5.11)
RDW: 13.1 % (ref 11.5–15.5)
WBC: 5.1 10*3/uL (ref 4.0–10.5)
nRBC: 0 % (ref 0.0–0.2)

## 2022-12-28 LAB — TROPONIN I (HIGH SENSITIVITY)
Troponin I (High Sensitivity): 4 ng/L (ref <18)
Troponin I (High Sensitivity): 3 ng/L (ref <18)

## 2022-12-28 LAB — LIPASE, BLOOD: Lipase: 26 U/L (ref 11–51)

## 2022-12-28 LAB — D-DIMER, QUANTITATIVE: D-Dimer, Quant: 1.28 ug/mL-FEU — ABNORMAL HIGH (ref 0.00–0.50)

## 2022-12-28 MED ORDER — ONDANSETRON HCL 4 MG/2ML IJ SOLN
4.0000 mg | INTRAMUSCULAR | Status: AC
  Administered 2022-12-28: 4 mg via INTRAVENOUS
  Filled 2022-12-28: qty 2

## 2022-12-28 MED ORDER — MORPHINE SULFATE (PF) 4 MG/ML IV SOLN
4.0000 mg | Freq: Once | INTRAVENOUS | Status: AC
  Administered 2022-12-28: 4 mg via INTRAVENOUS
  Filled 2022-12-28: qty 1

## 2022-12-28 MED ORDER — IOHEXOL 350 MG/ML SOLN
75.0000 mL | Freq: Once | INTRAVENOUS | Status: AC | PRN
  Administered 2022-12-28: 75 mL via INTRAVENOUS

## 2022-12-28 MED ORDER — NITROGLYCERIN 0.4 MG SL SUBL
0.4000 mg | SUBLINGUAL_TABLET | SUBLINGUAL | Status: DC | PRN
  Administered 2022-12-28 (×2): 0.4 mg via SUBLINGUAL
  Filled 2022-12-28 (×2): qty 1

## 2022-12-28 MED ORDER — ASPIRIN 81 MG PO CHEW
324.0000 mg | CHEWABLE_TABLET | Freq: Once | ORAL | Status: AC
  Administered 2022-12-28: 324 mg via ORAL
  Filled 2022-12-28: qty 4

## 2022-12-28 NOTE — Discharge Instructions (Addendum)
As we discussed, your evaluation was reassuring tonight.  We recommend you follow-up with cardiology; someone from the cardiology clinic will reach out to you to schedule follow-up appointment.  We also recommend you follow-up with your primary care provider to discuss the multiple sclerotic lesions seen on your thoracic vertebra.  These are most likely benign, but the radiologist recommended that you have a bone scan or a noncontrast CT chest as an outpatient to follow-up.  We recommend that you take 600 mg of ibuprofen 3 times a day with meals for at least the next 5 days to see if this helps with your discomfort.  There is no clear evidence that you are suffering from pericarditis, but if you wish to treat more aggressively as if for pericarditis, you should continue the dosage of ibuprofen for 7 to 10 days followed by a taper over 3 to 4 weeks.  Regarding the swelling to your left arm where your IV extravasated, please continue to use cold compresses.  If you develop worsening swelling, redness, pain, numbness/tingling, or other symptoms that concern you, please return to the emergency department.    Return to the emergency department if you develop new or worsening symptoms that concern you.

## 2022-12-28 NOTE — ED Notes (Signed)
ED Provider at bedside. 

## 2022-12-28 NOTE — ED Notes (Signed)
Patient transported to X-ray 

## 2022-12-28 NOTE — ED Triage Notes (Addendum)
Pt arrived via POV with reports of R side chest pain that started about 6pm and progressively getting worse, pt states she has not been able to get comfortable and pain is moving to the R shoulder. Pt denies any shortness of breath at this time.   Recently returned from trip from [place].

## 2022-12-28 NOTE — ED Provider Notes (Signed)
[facility] Provider Note    Event Date/Time   First MD Initiated Contact with Patient 12/28/22 0135     (approximate)   History   Chest Pain   HPI Lisa Bender is a 47 y.o. female with no chronic medical issues and who only takes a vitamin D supplement daily.  She presents for evaluation of acute onsets chest pain and pressure.  She feels a general sense of discomfort as well and feels that she cannot find a position of comfort.  The pain is localized to the right upper part of her chest and radiates down her right arm.  She has no numbness or weakness in her extremities.  No shortness of breath, nausea, vomiting, nor abdominal pain.  The symptoms began several hours prior to arrival and have gradually worsened.  She has difficulty describing the exact quality of the pain but is not something she is experienced in the past.  She has an IUD and does not use any exogenous estrogen.  No history of thromboembolism.  She recently made a trip to and from [place] by plane.  She has also been on a recent trip by car. No unilateral leg pain or swelling.     Physical Exam   Triage Vital Signs: ED Triage Vitals [12/28/22 0136]  Enc Vitals Group     BP (!) 146/78     Pulse Rate 60     Resp 20     Temp 98.6 F (37 C)     Temp Source Oral     SpO2 96 %     Weight 87.1 kg (192 lb)     Height 1.702 m (5\' 7" )     Head Circumference      Peak Flow      Pain Score 8     Pain Loc      Pain Edu?      Excl. in GC?     Most recent vital signs: Vitals:   12/28/22 0430 12/28/22 0553  BP: 132/71 (!) 143/83  Pulse: (!) 47 (!) 59  Resp: 13 18  Temp:  97.8 F (36.6 C)  SpO2: 97% 96%    General: Awake, no distress.  CV:  Good peripheral perfusion. Mild bradycardia, regular rhythm, normal heart sounds. Resp:  Normal effort. Speaking easily and comfortably, no accessory muscle usage nor intercostal retractions.  Lungs clear to auscultation. Abd:  No distention.  No tenderness to palpation. Other:  Normal mood/affect, no focal neuro deficits.   ED Results / Procedures / Treatments   Labs (all labs ordered are listed, but only abnormal results are displayed) Labs Reviewed  COMPREHENSIVE METABOLIC PANEL - Abnormal; Notable for the following components:      Result Value   Glucose, Bld 106 (*)    BUN 21 (*)    Calcium 8.3 (*)    All other components within normal limits  D-DIMER, QUANTITATIVE (NOT AT [facility]) - Abnormal; Notable for the following components:   D-Dimer, Quant 1.28 (*)    All other components within normal limits  CBC  LIPASE, BLOOD  POC URINE PREG, ED  TROPONIN I (HIGH SENSITIVITY)  TROPONIN I (HIGH SENSITIVITY)     EKG  ED ECG REPORT I, Loleta Rose, the attending physician, personally viewed and interpreted this ECG.  Date: 12/28/2022 EKG Time: 1:33 AM Rate: 62 Rhythm: normal sinus rhythm QRS Axis: normal Intervals: normal ST/T Wave abnormalities: Non-specific ST segment / T-wave changes, but no clear evidence  of acute ischemia. Narrative Interpretation: no definitive evidence of acute ischemia; does not meet STEMI criteria.    RADIOLOGY I viewed and interpreted the patient's two-view chest x-ray.  I see no evidence of pneumonia, pulmonary edema, widened mediastinum, nor other acute abnormality.  I also read the radiologist's report, which confirmed no acute findings.   PROCEDURES:  Critical Care performed: No  .1-3 Lead EKG Interpretation  Performed by: Loleta Rose, MD Authorized by: Loleta Rose, MD     Interpretation: normal     ECG rate:  60   ECG rate assessment: normal     Rhythm: sinus rhythm     Ectopy: none     Conduction: normal       IMPRESSION / MDM / ASSESSMENT AND PLAN / ED COURSE  I reviewed the triage vital signs and the nursing notes.                              Differential diagnosis includes, but is not limited to, ACS, PE, pneumonia, pericarditis/myocarditis,  musculoskeletal strain, costochondritis, AAS.  Patient's presentation is most consistent with acute presentation with potential threat to life or bodily function.  Labs/studies ordered: EKG, two-view chest x-ray, D-dimer, CMP, high-sensitivity troponin, lipase, CBC, CTA chest  Interventions/Medications given:  Medications  nitroGLYCERIN (NITROSTAT) SL tablet 0.4 mg (0.4 mg Sublingual Given 12/28/22 0213)  aspirin chewable tablet 324 mg (324 mg Oral Given 12/28/22 0155)  morphine (PF) 4 MG/ML injection 4 mg (4 mg Intravenous Given 12/28/22 0232)  ondansetron (ZOFRAN) injection 4 mg (4 mg Intravenous Given 12/28/22 0232)  iohexol (OMNIPAQUE) 350 MG/ML injection 75 mL (75 mLs Intravenous Contrast Given 12/28/22 0446)    (Note:  hospital course my include additional interventions and/or labs/studies not listed above.)   The patient has a low risk for ACS and I will score for PE of 0.  However she has had recent travel both international and domestic and is having what is consistent with pleuritic chest pain.  Despite her stable vital signs, I will continue with a cardiac evaluation but also check a D-dimer to attempt to rule out PE.  The patient understands that we will proceed with CTA chest if the D-dimer is elevated.  No signs of infectious process at this time.  We will also try nitroglycerin 0.4 mg sublingual every 5 minutes x 3 doses to see if this alleviates her discomfort.  The patient is on the cardiac monitor to evaluate for evidence of arrhythmia and/or significant heart rate changes.   Clinical Course as of 12/28/22 0656  Tue Dec 28, 2022  0225 No improvement with nitroglycerin.  Patient is also received aspirin and took Tylenol at home.  Ordered morphine 4 mg IV and Zofran 4 mg IV to try and prevent nausea/vomiting. [CF]  0318 Troponin I (High Sensitivity): 4 [CF]  0433 D-Dimer, Quant(!): 1.28 Elevation of D-dimer at 1.28.  Given pleuritic chest pain and D-dimer elevation and recent  travel history, I will proceed with CTA chest to rule out PE. [CF]  0529 I received word from Minerva Areola the CT technologist that the patient's IV infiltrated during IV contrast administration.  He put in the order for a cool compress.  A new peripheral IV has been placed by ultrasound and the patient will go back for scan. [CF]  0612 Troponin I (High Sensitivity): 3 Repeat troponin negative [CF]  1610 CT Angio Chest PE W/Cm &/Or Wo Cm I reviewed  the CTA chest and I see no evidence of PE nor of occult infectious process.  Radiologist agreed.  However they pointed out a number of sclerotic lesions of full thoracic vertebra and recommended outpatient bone scan or noncontrast CT chest for further assessment given the possibility of benign sclerosis versus sclerotic metastatic disease.  I updated the patient with the results and she was reassured.  She said that she feels much better than when she arrived.  I offered hospitalization but she declines in favor of outpatient follow-up with cardiology and with her PCP.  I think this is very reasonable given her reassuring workup.  She will also follow-up as an outpatient regarding the possible need for a bone scan.  I my usual and customary return precautions and the patient understands and agrees with the plan. [CF]    Clinical Course User Index [CF] Loleta Rose, MD     FINAL CLINICAL IMPRESSION(S) / ED DIAGNOSES   Final diagnoses:  Chest pain, unspecified type  Extravasation injury of IV catheter site with other complication, initial encounter [facility])     Rx / DC Orders   ED Discharge Orders          Ordered    Ambulatory referral to Cardiology       Comments: If you have not heard from the Cardiology office within the next 72 hours please call [phone].   12/28/22 8657             Note:  This document was prepared using Dragon voice recognition software and may include unintentional dictation errors.   Loleta Rose, MD 12/28/22  [phone]

## 2022-12-28 NOTE — ED Notes (Addendum)
Ice pack applied to IV infiltration site.

## 2023-01-07 ENCOUNTER — Other Ambulatory Visit: Payer: Self-pay | Admitting: Internal Medicine

## 2023-01-07 DIAGNOSIS — M899 Disorder of bone, unspecified: Secondary | ICD-10-CM

## 2023-01-20 ENCOUNTER — Ambulatory Visit
Admission: RE | Admit: 2023-01-20 | Discharge: 2023-01-20 | Disposition: A | Discharge status: Home / Self Care | Payer: Commercial Managed Care - PPO | Source: Ambulatory Visit | Attending: Internal Medicine | Admitting: Internal Medicine

## 2023-01-20 DIAGNOSIS — M899 Disorder of bone, unspecified: Secondary | ICD-10-CM

## 2023-01-20 MED ORDER — TECHNETIUM TC 99M MEDRONATE IV KIT
20.0000 | PACK | Freq: Once | INTRAVENOUS | Status: AC | PRN
  Administered 2023-01-20: 19.98 via INTRAVENOUS

## 2023-01-31 ENCOUNTER — Ambulatory Visit: Payer: Commercial Managed Care - PPO | Admitting: Cardiology

## 2023-03-23 ENCOUNTER — Ambulatory Visit
Admission: RE | Admit: 2023-03-23 | Discharge: 2023-03-23 | Disposition: A | Discharge status: Home / Self Care | Payer: Commercial Managed Care - PPO | Source: Ambulatory Visit | Attending: Obstetrics and Gynecology | Admitting: Obstetrics and Gynecology

## 2023-03-23 ENCOUNTER — Encounter: Payer: Self-pay | Admitting: Family Medicine

## 2023-03-23 DIAGNOSIS — Z1231 Encounter for screening mammogram for malignant neoplasm of breast: Secondary | ICD-10-CM | POA: Diagnosis not present

## 2023-08-09 ENCOUNTER — Other Ambulatory Visit: Payer: Self-pay

## 2023-08-15 ENCOUNTER — Other Ambulatory Visit: Payer: Self-pay

## 2023-08-15 MED ORDER — METFORMIN HCL ER 500 MG PO TB24
500.0000 mg | ORAL_TABLET | Freq: Every day | ORAL | 3 refills | Status: DC
  Filled 2023-08-15: qty 30, 30d supply, fill #0
  Filled 2023-09-07 – 2023-09-08 (×2): qty 30, 30d supply, fill #1
  Filled 2023-10-10: qty 30, 30d supply, fill #2
  Filled 2023-11-04: qty 30, 30d supply, fill #3

## 2023-09-07 ENCOUNTER — Other Ambulatory Visit: Payer: Self-pay

## 2023-09-08 ENCOUNTER — Other Ambulatory Visit: Payer: Self-pay

## 2023-09-12 ENCOUNTER — Other Ambulatory Visit: Payer: Self-pay

## 2023-10-10 ENCOUNTER — Other Ambulatory Visit: Payer: Self-pay

## 2023-10-21 DIAGNOSIS — Z23 Encounter for immunization: Secondary | ICD-10-CM | POA: Diagnosis not present

## 2023-11-04 ENCOUNTER — Other Ambulatory Visit: Payer: Self-pay

## 2023-12-01 ENCOUNTER — Other Ambulatory Visit: Payer: Self-pay

## 2023-12-01 DIAGNOSIS — Z1211 Encounter for screening for malignant neoplasm of colon: Secondary | ICD-10-CM | POA: Diagnosis not present

## 2023-12-01 DIAGNOSIS — Z0000 Encounter for general adult medical examination without abnormal findings: Secondary | ICD-10-CM | POA: Diagnosis not present

## 2023-12-01 DIAGNOSIS — Z1331 Encounter for screening for depression: Secondary | ICD-10-CM | POA: Diagnosis not present

## 2023-12-01 DIAGNOSIS — E559 Vitamin D deficiency, unspecified: Secondary | ICD-10-CM | POA: Diagnosis not present

## 2023-12-01 MED ORDER — ERGOCALCIFEROL 1.25 MG (50000 UT) PO CAPS
50000.0000 [IU] | ORAL_CAPSULE | ORAL | 3 refills | Status: AC
  Filled 2023-12-01: qty 12, 84d supply, fill #0
  Filled 2024-03-01: qty 12, 84d supply, fill #1

## 2023-12-01 MED ORDER — METFORMIN HCL ER 500 MG PO TB24
500.0000 mg | ORAL_TABLET | Freq: Every day | ORAL | 3 refills | Status: AC
  Filled 2023-12-01: qty 90, 90d supply, fill #0

## 2023-12-12 ENCOUNTER — Other Ambulatory Visit: Payer: Self-pay

## 2023-12-12 DIAGNOSIS — Z1211 Encounter for screening for malignant neoplasm of colon: Secondary | ICD-10-CM | POA: Diagnosis not present

## 2023-12-12 DIAGNOSIS — Z01419 Encounter for gynecological examination (general) (routine) without abnormal findings: Secondary | ICD-10-CM | POA: Diagnosis not present

## 2023-12-12 DIAGNOSIS — Z0000 Encounter for general adult medical examination without abnormal findings: Secondary | ICD-10-CM | POA: Diagnosis not present

## 2023-12-12 DIAGNOSIS — Z1331 Encounter for screening for depression: Secondary | ICD-10-CM | POA: Diagnosis not present

## 2023-12-12 DIAGNOSIS — Z1231 Encounter for screening mammogram for malignant neoplasm of breast: Secondary | ICD-10-CM | POA: Diagnosis not present

## 2023-12-12 MED ORDER — ATORVASTATIN CALCIUM 40 MG PO TABS
40.0000 mg | ORAL_TABLET | Freq: Every day | ORAL | 11 refills | Status: AC
  Filled 2023-12-12: qty 90, 90d supply, fill #0

## 2023-12-16 LAB — COLOGUARD: COLOGUARD: NEGATIVE

## 2024-03-01 ENCOUNTER — Other Ambulatory Visit: Payer: Self-pay

## 2024-03-07 ENCOUNTER — Other Ambulatory Visit: Payer: Self-pay | Admitting: Obstetrics and Gynecology

## 2024-03-07 DIAGNOSIS — Z1231 Encounter for screening mammogram for malignant neoplasm of breast: Secondary | ICD-10-CM

## 2024-03-27 ENCOUNTER — Encounter

## 2024-04-11 ENCOUNTER — Encounter

## 2024-05-07 ENCOUNTER — Ambulatory Visit
Admission: RE | Admit: 2024-05-07 | Discharge: 2024-05-07 | Disposition: A | Discharge status: Home / Self Care | Source: Ambulatory Visit | Attending: Obstetrics and Gynecology | Admitting: Obstetrics and Gynecology

## 2024-05-07 DIAGNOSIS — Z1231 Encounter for screening mammogram for malignant neoplasm of breast: Secondary | ICD-10-CM | POA: Insufficient documentation

## 2024-06-18 ENCOUNTER — Other Ambulatory Visit: Payer: Self-pay

## 2024-06-19 ENCOUNTER — Other Ambulatory Visit: Payer: Self-pay

## 2024-06-19 MED ORDER — OSELTAMIVIR PHOSPHATE 75 MG PO CAPS
75.0000 mg | ORAL_CAPSULE | Freq: Two times a day (BID) | ORAL | 0 refills | Status: AC
  Filled 2024-06-19: qty 10, 5d supply, fill #0
